# Patient Record
Sex: Female | Born: 2000 | Race: Black or African American | Hispanic: No | Marital: Single | Smoking: Never smoker
Health system: Southern US, Community
[De-identification: ages and names within clinical notes are randomized; demographics above are authoritative.]

## PROBLEM LIST (undated history)

## (undated) DIAGNOSIS — Z789 Other specified health status: Secondary | ICD-10-CM

## (undated) DIAGNOSIS — W3400XA Accidental discharge from unspecified firearms or gun, initial encounter: Secondary | ICD-10-CM

## (undated) DIAGNOSIS — Y249XXA Unspecified firearm discharge, undetermined intent, initial encounter: Secondary | ICD-10-CM

## (undated) HISTORY — PX: APPENDECTOMY: SHX54

---
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
## 2021-07-23 ENCOUNTER — Inpatient Hospital Stay (HOSPITAL_COMMUNITY)
Admission: AD | Admit: 2021-07-23 | Discharge: 2021-07-26 | DRG: 831 | Disposition: A | Discharge status: Home / Self Care | Payer: Medicaid Other | Attending: Obstetrics & Gynecology | Admitting: Obstetrics & Gynecology

## 2021-07-23 ENCOUNTER — Encounter (HOSPITAL_COMMUNITY): Payer: Self-pay

## 2021-07-23 ENCOUNTER — Other Ambulatory Visit: Payer: Self-pay

## 2021-07-23 DIAGNOSIS — O98513 Other viral diseases complicating pregnancy, third trimester: Secondary | ICD-10-CM | POA: Diagnosis present

## 2021-07-23 DIAGNOSIS — O200 Threatened abortion: Secondary | ICD-10-CM | POA: Diagnosis not present

## 2021-07-23 DIAGNOSIS — Z3A34 34 weeks gestation of pregnancy: Secondary | ICD-10-CM

## 2021-07-23 DIAGNOSIS — O3433 Maternal care for cervical incompetence, third trimester: Secondary | ICD-10-CM

## 2021-07-23 DIAGNOSIS — D563 Thalassemia minor: Secondary | ICD-10-CM | POA: Diagnosis present

## 2021-07-23 DIAGNOSIS — U071 COVID-19: Secondary | ICD-10-CM | POA: Diagnosis present

## 2021-07-23 DIAGNOSIS — Z3A35 35 weeks gestation of pregnancy: Secondary | ICD-10-CM | POA: Diagnosis not present

## 2021-07-23 DIAGNOSIS — Z3689 Encounter for other specified antenatal screening: Secondary | ICD-10-CM

## 2021-07-23 HISTORY — DX: Other specified health status: Z78.9

## 2021-07-23 LAB — RESP PANEL BY RT-PCR (FLU A&B, COVID) ARPGX2
Influenza A by PCR: NEGATIVE
Influenza B by PCR: NEGATIVE
SARS Coronavirus 2 by RT PCR: POSITIVE — AB

## 2021-07-23 LAB — DIFFERENTIAL
Abs Immature Granulocytes: 0.12 10*3/uL — ABNORMAL HIGH (ref 0.00–0.07)
Basophils Absolute: 0 10*3/uL (ref 0.0–0.1)
Basophils Relative: 0 %
Eosinophils Absolute: 0.2 10*3/uL (ref 0.0–0.5)
Eosinophils Relative: 2 %
Immature Granulocytes: 1 %
Lymphocytes Relative: 20 %
Lymphs Abs: 2.1 10*3/uL (ref 0.7–4.0)
Monocytes Absolute: 0.9 10*3/uL (ref 0.1–1.0)
Monocytes Relative: 9 %
Neutro Abs: 7 10*3/uL (ref 1.7–7.7)
Neutrophils Relative %: 68 %

## 2021-07-23 LAB — RAPID HIV SCREEN (HIV 1/2 AB+AG)
HIV 1/2 Antibodies: NONREACTIVE
HIV-1 P24 Antigen - HIV24: NONREACTIVE

## 2021-07-23 LAB — CBC
HCT: 34.8 % — ABNORMAL LOW (ref 36.0–46.0)
Hemoglobin: 11.5 g/dL — ABNORMAL LOW (ref 12.0–15.0)
MCH: 28 pg (ref 26.0–34.0)
MCHC: 33 g/dL (ref 30.0–36.0)
MCV: 84.9 fL (ref 80.0–100.0)
Platelets: 311 10*3/uL (ref 150–400)
RBC: 4.1 MIL/uL (ref 3.87–5.11)
RDW: 13.1 % (ref 11.5–15.5)
WBC: 10.6 10*3/uL — ABNORMAL HIGH (ref 4.0–10.5)
nRBC: 0 % (ref 0.0–0.2)

## 2021-07-23 LAB — TYPE AND SCREEN
ABO/RH(D): A POS
Antibody Screen: NEGATIVE

## 2021-07-23 LAB — HEPATITIS C ANTIBODY: HCV Ab: NONREACTIVE

## 2021-07-23 LAB — OB RESULTS CONSOLE HIV ANTIBODY (ROUTINE TESTING): HIV: NONREACTIVE

## 2021-07-23 LAB — HEPATITIS B SURFACE ANTIGEN: Hepatitis B Surface Ag: NONREACTIVE

## 2021-07-23 MED ORDER — LACTATED RINGERS IV SOLN: 500.0000 mL | INTRAVENOUS | Status: DC | PRN

## 2021-07-23 MED ORDER — CALCIUM CARBONATE ANTACID 500 MG PO CHEW: 2.0000 | CHEWABLE_TABLET | ORAL | Status: DC | PRN

## 2021-07-23 MED ORDER — DOCUSATE SODIUM 100 MG PO CAPS
100.0000 mg | ORAL_CAPSULE | Freq: Every day | ORAL | Status: DC
  Filled 2021-07-23 (×2): qty 1

## 2021-07-23 MED ORDER — SODIUM CHLORIDE 0.9 % IV SOLN
5.0000 10*6.[IU] | Freq: Once | INTRAVENOUS | Status: AC
  Administered 2021-07-23: 5 10*6.[IU] via INTRAVENOUS
  Filled 2021-07-23: qty 5

## 2021-07-23 MED ORDER — SODIUM CHLORIDE 0.9 % IV SOLN: 250.0000 mL | INTRAVENOUS | Status: DC | PRN

## 2021-07-23 MED ORDER — LACTATED RINGERS IV BOLUS
1000.0000 mL | Freq: Once | INTRAVENOUS | Status: AC
  Administered 2021-07-23: 1000 mL via INTRAVENOUS

## 2021-07-23 MED ORDER — SODIUM CHLORIDE 0.9% FLUSH
3.0000 mL | Freq: Two times a day (BID) | INTRAVENOUS | Status: DC
  Administered 2021-07-24: 3 mL via INTRAVENOUS

## 2021-07-23 MED ORDER — ACETAMINOPHEN 325 MG PO TABS: 650.0000 mg | ORAL_TABLET | ORAL | Status: DC | PRN

## 2021-07-23 MED ORDER — OXYTOCIN-SODIUM CHLORIDE 30-0.9 UT/500ML-% IV SOLN: 2.5000 [IU]/h | INTRAVENOUS | Status: DC

## 2021-07-23 MED ORDER — LACTATED RINGERS IV SOLN: INTRAVENOUS | Status: DC

## 2021-07-23 MED ORDER — OXYTOCIN BOLUS FROM INFUSION: 333.0000 mL | Freq: Once | INTRAVENOUS | Status: DC

## 2021-07-23 MED ORDER — SODIUM CHLORIDE 0.9% FLUSH
3.0000 mL | INTRAVENOUS | Status: DC | PRN
  Administered 2021-07-24: 3 mL via INTRAVENOUS

## 2021-07-23 MED ORDER — OXYCODONE-ACETAMINOPHEN 5-325 MG PO TABS: 1.0000 | ORAL_TABLET | ORAL | Status: DC | PRN

## 2021-07-23 MED ORDER — LIDOCAINE HCL (PF) 1 % IJ SOLN: 30.0000 mL | INTRAMUSCULAR | Status: DC | PRN

## 2021-07-23 MED ORDER — ZOLPIDEM TARTRATE 5 MG PO TABS: 5.0000 mg | ORAL_TABLET | Freq: Every evening | ORAL | Status: DC | PRN

## 2021-07-23 MED ORDER — OXYCODONE-ACETAMINOPHEN 5-325 MG PO TABS: 2.0000 | ORAL_TABLET | ORAL | Status: DC | PRN

## 2021-07-23 MED ORDER — PENICILLIN G POT IN DEXTROSE 60000 UNIT/ML IV SOLN
3.0000 10*6.[IU] | INTRAVENOUS | Status: DC
  Administered 2021-07-23 (×2): 3 10*6.[IU] via INTRAVENOUS
  Filled 2021-07-23 (×5): qty 50

## 2021-07-23 MED ORDER — ONDANSETRON HCL 4 MG/2ML IJ SOLN: 4.0000 mg | Freq: Four times a day (QID) | INTRAMUSCULAR | Status: DC | PRN

## 2021-07-23 MED ORDER — PRENATAL MULTIVITAMIN CH
1.0000 | ORAL_TABLET | Freq: Every day | ORAL | Status: DC
  Administered 2021-07-24 – 2021-07-25 (×2): 1 via ORAL
  Filled 2021-07-23 (×2): qty 1

## 2021-07-23 MED ORDER — SOD CITRATE-CITRIC ACID 500-334 MG/5ML PO SOLN: 30.0000 mL | ORAL | Status: DC | PRN

## 2021-07-23 NOTE — Progress Notes (Signed)
Charlene Berry is a 21 y.o. G1P0 at [redacted]w[redacted]d admitted for Preterm labor  Subjective: Reports feels some contractions and pressure but not more than before. Denies any rectal pressure.  Objective: BP (!) 103/51    Pulse 79    Temp 98.3 F (36.8 C) (Oral)    Resp 18    Ht 5' (1.524 m)    Wt 60.8 kg    SpO2 98%    BMI 26.17 kg/m  No intake/output data recorded. No intake/output data recorded.  FHT:  FHR: 120 bpm, variability: moderate,  accelerations:  Present,  decelerations:  Absent UC:   irregular, every 2-5 minutes SVE:   Dilation: 5 Effacement (%): 60 Station: 0 Exam by:: Donia Ast, NP  Labs: Lab Results  Component Value Date   WBC 10.6 (H) 07/23/2021   HGB 11.5 (L) 07/23/2021   HCT 34.8 (L) 07/23/2021   MCV 84.9 07/23/2021   PLT 311 07/23/2021    Assessment / Plan: G1P0 at [redacted]w[redacted]d admitted for Preterm labor  Labor:  patient feeling some contractions but no large change in symptoms. On toco uterine irritability noted and then some contractions that are irregular. Discussed with patient that we can hold off on checking her cervix unless she feels any different or constant pressure or feeling urge to push. She expresses understanding and is in agreement with plan  Fetal Wellbeing:  Category I Pain Control:   None I/D:   GBS unk>PCN   #COVID+ Asymptomatic at this time. Had symptoms a couple of weeks ago.  Warner Mccreedy 07/23/2021, 4:55 PM

## 2021-07-23 NOTE — Progress Notes (Signed)
Charlene Berry is a 21 y.o. G1P0 at [redacted]w[redacted]d admitted for Preterm labor  Subjective: Reports feels some contractions however has mostly been able to sleep through them.  Objective: BP (!) 85/50 Comment: pt asymptomatic; on side   Pulse 91    Temp 98.5 F (36.9 C) (Oral)    Resp 16    Ht 5' (1.524 m)    Wt 60.8 kg    SpO2 98%    BMI 26.17 kg/m  No intake/output data recorded. No intake/output data recorded.  FHT:  FHR: 130 bpm, variability: moderate,  accelerations:  Present,  decelerations:  Absent UC:  irritability SVE:   Dilation: 5 Effacement (%): 60, 70 Station: 0 Exam by:: D. Simspon, CNM  Labs: Lab Results  Component Value Date   WBC 10.6 (H) 07/23/2021   HGB 11.5 (L) 07/23/2021   HCT 34.8 (L) 07/23/2021   MCV 84.9 07/23/2021   PLT 311 07/23/2021    Assessment / Plan: G1P0 at [redacted]w[redacted]d admitted for Preterm labor  Labor: patient feeling some contractions but is mostly able to sleep through them. Toco shows some irritability. Cervix unchanged since 0915. Discussed with patient that given no change in cervix will transfer to [facility]. Dr. Debroah Loop notified of patient status.  Fetal Wellbeing:  Category I Pain Control:   None I/D:   GBS unk>PCN   #COVID+ Asymptomatic at this time. Had symptoms a couple of weeks ago.   Brand Males, CNM 07/23/2021, 9:05 PM

## 2021-07-23 NOTE — Progress Notes (Signed)
Patient ID: Charlene Berry, female   DOB: 30-Aug-2000, 21 y.o.   MRN: [id number] Covid resulted as positive Patient states she and her family were sick a couple of weeks ago  Vitals:   07/23/21 1011 07/23/21 1015 07/23/21 1217 07/23/21 1434  BP: 116/66  108/63 (!) 97/48  Pulse: 93  90 89  Resp: 16  16 17   Temp: 98.2 F (36.8 C)   98.3 F (36.8 C)  TempSrc: Oral   Oral  SpO2:      Weight:  60.8 kg    Height:  5' (1.524 m)     FHR reactive 120s baseline with accels Uterine irritability with occasional contractions  Cervical exam deferred

## 2021-07-23 NOTE — MAU Note (Signed)
Pt reports to mau with c/o ctx since yesterday.  Pt states she was at [facility] and then sent to a hospital in [place] by ems yesterday.  Reports she was 4cm dilated and sent home.  EDD 08-29-21  Reports she lost mucous plug this morning and is still having ctx.  Denies LOF
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
## 2021-07-24 ENCOUNTER — Encounter (HOSPITAL_COMMUNITY): Payer: Self-pay | Admitting: Obstetrics & Gynecology

## 2021-07-24 LAB — RUBELLA SCREEN: Rubella: 6.99 index (ref >0.99)

## 2021-07-24 LAB — RPR: RPR Ser Ql: NONREACTIVE

## 2021-07-24 NOTE — Progress Notes (Signed)
Patient ID: Charlene Berry, female   DOB: 10/22/2000, 21 y.o.   MRN: [id number] FACULTY PRACTICE ANTEPARTUM(COMPREHENSIVE) NOTE  Charlene Berry is a 21 y.o. G1P0 at [redacted]w[redacted]d by best clinical estimate who is admitted for Preterm labor.   Fetal presentation is cephalic. Length of Stay:  1  Days  Subjective: Irregular mild cramps Patient reports the fetal movement as active. Patient reports uterine contraction  activity as irregular. Patient reports  vaginal bleeding as none. Patient describes fluid per vagina as None.  Vitals:  Blood pressure 115/69, pulse 91, temperature 97.7 F (36.5 C), temperature source Oral, resp. rate 16, height 5' (1.524 m), weight 60.8 kg, SpO2 98 %. Physical Examination:  General appearance - alert, well appearing, and in no distress Heart - normal rate and regular rhythm Abdomen - soft, nontender, nondistended Fundal Height:  size equals dates Cervical Exam: Not evaluated. . Extremities: extremities normal, atraumatic, no cyanosis or edema and Homans sign is negative, no sign of DVT  Membranes:intact  Fetal Monitoring:   Fetal Heart Rate A   Mode --  [monitors removed,  patient up to the bathroom. Shower] filed at 07/23/2021 2316  Baseline Rate (A) 125 bpm filed at 07/23/2021 2312  Variability 6-25 BPM filed at 07/23/2021 2312  Accelerations 15 x 15 filed at 07/23/2021 2312  Decelerations None filed at 07/23/2021 2312    Labs:  Results for orders placed or performed during the hospital encounter of 07/23/21 (from the past 24 hour(s))  CBC   Collection Time: 07/23/21  9:44 AM  Result Value Ref Range   WBC 10.6 (H) 4.0 - 10.5 K/uL   RBC 4.10 3.87 - 5.11 MIL/uL   Hemoglobin 11.5 (L) 12.0 - 15.0 g/dL   HCT 35.5 (L) 73.2 - 20.2 %   MCV 84.9 80.0 - 100.0 fL   MCH 28.0 26.0 - 34.0 pg   MCHC 33.0 30.0 - 36.0 g/dL   RDW 54.2 70.6 - 23.7 %   Platelets 311 150 - 400 K/uL   nRBC 0.0 0.0 - 0.2 %  Hepatitis B surface antigen   Collection Time: 07/23/21  9:44  AM  Result Value Ref Range   Hepatitis B Surface Ag NON REACTIVE NON REACTIVE  Rapid HIV screen (HIV 1/2 Ab+Ag)   Collection Time: 07/23/21  9:44 AM  Result Value Ref Range   HIV-1 P24 Antigen - HIV24 NON REACTIVE NON REACTIVE   HIV 1/2 Antibodies NON REACTIVE NON REACTIVE   Interpretation (HIV Ag Ab)      A non reactive test result means that HIV 1 or HIV 2 antibodies and HIV 1 p24 antigen were not detected in the specimen.  Differential   Collection Time: 07/23/21  9:44 AM  Result Value Ref Range   Neutrophils Relative % 68 %   Neutro Abs 7.0 1.7 - 7.7 K/uL   Lymphocytes Relative 20 %   Lymphs Abs 2.1 0.7 - 4.0 K/uL   Monocytes Relative 9 %   Monocytes Absolute 0.9 0.1 - 1.0 K/uL   Eosinophils Relative 2 %   Eosinophils Absolute 0.2 0.0 - 0.5 K/uL   Basophils Relative 0 %   Basophils Absolute 0.0 0.0 - 0.1 K/uL   Immature Granulocytes 1 %   Abs Immature Granulocytes 0.12 (H) 0.00 - 0.07 K/uL  Type and screen [facility]   Collection Time: 07/23/21  9:44 AM  Result Value Ref Range   ABO/RH(D) A POS    Antibody Screen NEG  Sample Expiration      07/26/2021,2359 Performed at [facility], [address], [place], [place] [address]   Resp Panel by RT-PCR (Flu A&B, Covid) Nasopharyngeal Swab   Collection Time: 07/23/21  9:55 AM   Specimen: Nasopharyngeal Swab; Nasopharyngeal(NP) swabs in vial transport medium  Result Value Ref Range   SARS Coronavirus 2 by RT PCR POSITIVE (A) NEGATIVE   Influenza A by PCR NEGATIVE NEGATIVE   Influenza B by PCR NEGATIVE NEGATIVE  Hepatitis C antibody   Collection Time: 07/23/21  9:56 AM  Result Value Ref Range   HCV Ab NON REACTIVE NON REACTIVE     Medications:  Scheduled  docusate sodium  100 mg Oral Daily   oxytocin 40 units in LR 1000 mL  333 mL Intravenous Once   prenatal multivitamin  1 tablet Oral Q1200   sodium chloride flush  3 mL Intravenous Q12H   I have reviewed the patient's current  medications.  ASSESSMENT: Patient Active Problem List   Diagnosis Date Noted   Preterm labor 07/23/2021    PLAN: Observe for s/sx preterm labor and consider cervical exam as indicated  Scheryl Darter 07/24/2021,7:31 AM

## 2021-07-25 LAB — GC/CHLAMYDIA PROBE AMP (~~LOC~~) NOT AT ARMC
Chlamydia: NEGATIVE
Comment: NEGATIVE
Comment: NORMAL
Neisseria Gonorrhea: NEGATIVE

## 2021-07-25 LAB — CULTURE, BETA STREP (GROUP B ONLY)

## 2021-07-25 LAB — URINALYSIS, ROUTINE W REFLEX MICROSCOPIC
Bilirubin Urine: NEGATIVE
Glucose, UA: NEGATIVE mg/dL
Hgb urine dipstick: NEGATIVE
Ketones, ur: NEGATIVE mg/dL
Nitrite: NEGATIVE
Protein, ur: NEGATIVE mg/dL
Specific Gravity, Urine: <1.005 — ABNORMAL LOW (ref 1.005–1.030)
pH: 6 (ref 5.0–8.0)

## 2021-07-25 LAB — URINALYSIS, MICROSCOPIC (REFLEX)

## 2021-07-25 LAB — RESP PANEL BY RT-PCR (FLU A&B, COVID) ARPGX2
Influenza A by PCR: NEGATIVE
Influenza B by PCR: NEGATIVE
SARS Coronavirus 2 by RT PCR: POSITIVE — AB

## 2021-07-25 MED ORDER — LACTATED RINGERS IV BOLUS: 500.0000 mL | Freq: Once | INTRAVENOUS | Status: DC

## 2021-07-25 NOTE — Progress Notes (Signed)
FACULTY PRACTICE ANTEPARTUM PROGRESS NOTE  Charlene Berry is a 21 y.o. G1P0 at [redacted]w[redacted]d who is admitted for advanced cervical dilation.  Estimated Date of Delivery: 08/29/21 Fetal presentation is cephalic.  Length of Stay:  2 Days. Admitted 07/23/2021  Subjective: Pt seen this morning.  She has noted intermittent contractions today.  Positive fetal movement noted.  Pt appears comfortable. Shedenies bleeding and leaking of fluid per vagina.  Vitals:  Blood pressure 115/71, pulse 88, temperature 98.5 F (36.9 C), temperature source Oral, resp. rate 16, height 5' (1.524 m), weight 60.8 kg, SpO2 100 %. Physical Examination: CONSTITUTIONAL: Well-developed, well-nourished female in no acute distress.  HENT:  Normocephalic, atraumatic, External right and left ear normal. Oropharynx is clear and moist EYES: Conjunctivae and EOM are normal.  NECK: Normal range of motion, supple, no masses. SKIN: Skin is warm and dry. No rash noted. Not diaphoretic. No erythema. No pallor. NEUROLGIC: Alert and oriented to person, place, and time. Normal reflexes, muscle tone coordination. No cranial nerve deficit noted. PSYCHIATRIC: Normal mood and affect. Normal behavior. Normal judgment and thought content. CARDIOVASCULAR: Normal heart rate noted, regular rhythm RESPIRATORY: Effort and breath sounds normal, no problems with respiration noted MUSCULOSKELETAL: Normal range of motion. No edema and no tenderness. ABDOMEN: Soft, nontender, nondistended, gravid. CERVIX: deferred  Fetal monitoring: FHR: 120 bpm, Variability: moderate, Accelerations: Present, Decelerations: Absent  Uterine activity: q , pt comfortable with contractions.  Uterus palpated as firm  Results for orders placed or performed during the hospital encounter of 07/23/21 (from the past 48 hour(s))  Urinalysis, Routine w reflex microscopic Urine, Clean Catch     Status: Abnormal   Collection Time: 07/25/21  3:30 AM  Result Value Ref Range    Color, Urine YELLOW YELLOW   APPearance CLEAR CLEAR   Specific Gravity, Urine <1.005 (L) 1.005 - 1.030   pH 6.0 5.0 - 8.0   Glucose, UA NEGATIVE NEGATIVE mg/dL   Hgb urine dipstick NEGATIVE NEGATIVE   Bilirubin Urine NEGATIVE NEGATIVE   Ketones, ur NEGATIVE NEGATIVE mg/dL   Protein, ur NEGATIVE NEGATIVE mg/dL   Nitrite NEGATIVE NEGATIVE   Leukocytes,Ua TRACE (A) NEGATIVE    Comment: Performed at [facility], [address], [place], [place] [address]  Urinalysis, Microscopic (reflex)     Status: Abnormal   Collection Time: 07/25/21  3:30 AM  Result Value Ref Range   RBC / HPF 0-5 0 - 5 RBC/hpf   WBC, UA 0-5 0 - 5 WBC/hpf   Bacteria, UA RARE (A) NONE SEEN   Squamous Epithelial / LPF 0-5 0 - 5   Mucus PRESENT     Comment: Performed at [facility], [address] [place], [place] [address]    I have reviewed the patient's current medications.  ASSESSMENT: Principal Problem:   Preterm labor   PLAN: Continue monitoring Discussed cervical exam on 07/26/21.  If stable consider discharge. If continues with contractions remain inpatient until 37 weeks.  If pt is stable D/C home   Continue routine antenatal care.   Mariel Aloe, MD [facility] Faculty Attending, Center for [facility] 07/25/2021 12:36 PM

## 2021-07-25 NOTE — Progress Notes (Signed)
Pt reports increased contractions since about 0630 this morning, rating 6/10 on pain scale. Palpating mild to moderate, q7-8 min lasting 70-120sec. Dr. Elgie Congo notified and 500 mL LR bolus ordered, which patient refused after RN educated on benefits of IV hydration for preterm labor. Pt remains on monitor per Dr. Elgie Congo request. RN recommended PO hydration, brought new water pitcher to bedside, and requested pt to notify of any changes (LOF, bleeding, ctx, fetal movement).

## 2021-07-26 DIAGNOSIS — Z3A35 35 weeks gestation of pregnancy: Secondary | ICD-10-CM

## 2021-07-26 DIAGNOSIS — O98513 Other viral diseases complicating pregnancy, third trimester: Secondary | ICD-10-CM

## 2021-07-26 DIAGNOSIS — U071 COVID-19: Secondary | ICD-10-CM

## 2021-07-26 MED ORDER — PRENATAL MULTIVITAMIN CH: 1.0000 | ORAL_TABLET | Freq: Every day | ORAL | Status: AC

## 2021-07-26 MED ORDER — CALCIUM CARBONATE ANTACID 500 MG PO CHEW: 2.0000 | CHEWABLE_TABLET | ORAL | Status: AC | PRN

## 2021-07-26 MED ORDER — ACETAMINOPHEN 325 MG PO TABS: 650.0000 mg | ORAL_TABLET | ORAL | Status: AC | PRN

## 2021-07-26 NOTE — Progress Notes (Signed)
Pt seen for cervical exam.  Cervix was 3/70/0-1+.  Exam improved from previous.  She denied organized contractions.  Pt will be discharged home.  Pt previously seen this AM by Dr. Para March.  Discharge home.   Mariel Aloe, MD Center for [facility]

## 2021-07-26 NOTE — Discharge Summary (Signed)
Antenatal Physician Discharge Summary  Patient ID: Charlene Berry MRN: [id number] DOB/AGE: 2000/11/03 21 y.o.  Admit date: 07/23/2021 Discharge date: 07/26/2021  Admission Diagnoses: Threatened Preterm labor Pregnancy with 35 completed weeks Advanced cervical dilation COVID positive  Discharge Diagnoses:  Threatened Preterm labor Pregnancy with 35 completed weeks Advanced cervical dilation COVID positive  Prenatal Procedures: CEFM  Consults: None  Hospital Course:  Charlene Berry is a 21 y.o. G1P0 with IUP at 2861w1d admitted for PT contractions in setting of advanced cervical dilation.  She was admitted with contractions, noted to have a cervical exam of 5.  No leaking of fluid and no bleeding. She was given PCN for unknown GBS which the culture has since come back GBS negative.  Complete prenatal labs were checked here as well.  During her admission she was found to be asymptomatic positive for COVID and was on contact precautions during her stay. She was observed, fetal heart rate monitoring remained reassuring, and she had no signs/symptoms of progressing preterm labor or other maternal-fetal concerns.  Her cervical exam has remained unchanged during her admission and on day of discharge in the afternoon, Dr. Donavan FoilBass will recheck her cervix before the patient would actually go to ensure no change.   Pt thinks if she goes into labor before 37 weeks, she will come here for delivery.  She was deemed stable for discharge to home with outpatient follow up and labor precautions.   Discharge Exam: Temp:  [98.5 F (36.9 C)-98.6 F (37 C)] 98.6 F (37 C) (01/30 2233) Pulse Rate:  [82-98] 82 (01/30 2233) Resp:  [16-18] 16 (01/30 2233) BP: (99-123)/(46-74) 99/46 (01/30 2233) SpO2:  [99 %-100 %] 99 % (01/30 2233) Physical Examination: CONSTITUTIONAL: Well-developed, well-nourished female in no acute distress.  HENT:  Normocephalic, atraumatic, External right and left ear normal.  EYES:  Conjunctivae and EOM are normal. Pupils are equal, round, and reactive to light. No scleral icterus.  NECK: Normal range of motion, supple, no masses SKIN: Skin is warm and dry. No rash noted. Not diaphoretic. No erythema. No pallor. NEUROLOGIC: Alert and oriented to person, place, and time. Normal reflexes, muscle tone coordination. No cranial nerve deficit noted. PSYCHIATRIC: Normal mood and affect. Normal behavior. Normal judgment and thought content. CARDIOVASCULAR: Normal heart rate noted, regular rhythm RESPIRATORY: Effort and breath sounds normal, no problems with respiration noted MUSCULOSKELETAL: Normal range of motion. No edema and no tenderness. 2+ distal pulses. ABDOMEN: Soft, nontender, nondistended, gravid. CERVIX: Dilation: 5 Effacement (%): 60, 70 Cervical Position: Posterior Station: 0 Presentation: Vertex Exam by:: D. Simspon, CNM  Fetal monitoring: FHR: 130 bpm, Variability: moderate, Accelerations: Present, Decelerations: Absent  Uterine activity: one contractions per hour  Significant Diagnostic Studies:  Results for orders placed or performed during the hospital encounter of 07/23/21 (from the past 168 hour(s))  OB RESULTS CONSOLE HIV antibody   Collection Time: 07/23/21 12:00 AM  Result Value Ref Range   HIV Non-reactive   CBC   Collection Time: 07/23/21  9:44 AM  Result Value Ref Range   WBC 10.6 (H) 4.0 - 10.5 K/uL   RBC 4.10 3.87 - 5.11 MIL/uL   Hemoglobin 11.5 (L) 12.0 - 15.0 g/dL   HCT 40.934.8 (L) 81.136.0 - 91.446.0 %   MCV 84.9 80.0 - 100.0 fL   MCH 28.0 26.0 - 34.0 pg   MCHC 33.0 30.0 - 36.0 g/dL   RDW 78.213.1 95.611.5 - 21.315.5 %   Platelets 311 150 - 400 K/uL   nRBC  0.0 0.0 - 0.2 %  RPR   Collection Time: 07/23/21  9:44 AM  Result Value Ref Range   RPR Ser Ql NON REACTIVE NON REACTIVE  Hepatitis B surface antigen   Collection Time: 07/23/21  9:44 AM  Result Value Ref Range   Hepatitis B Surface Ag NON REACTIVE NON REACTIVE  Rapid HIV screen (HIV 1/2 Ab+Ag)    Collection Time: 07/23/21  9:44 AM  Result Value Ref Range   HIV-1 P24 Antigen - HIV24 NON REACTIVE NON REACTIVE   HIV 1/2 Antibodies NON REACTIVE NON REACTIVE   Interpretation (HIV Ag Ab)      A non reactive test result means that HIV 1 or HIV 2 antibodies and HIV 1 p24 antigen were not detected in the specimen.  Differential   Collection Time: 07/23/21  9:44 AM  Result Value Ref Range   Neutrophils Relative % 68 %   Neutro Abs 7.0 1.7 - 7.7 K/uL   Lymphocytes Relative 20 %   Lymphs Abs 2.1 0.7 - 4.0 K/uL   Monocytes Relative 9 %   Monocytes Absolute 0.9 0.1 - 1.0 K/uL   Eosinophils Relative 2 %   Eosinophils Absolute 0.2 0.0 - 0.5 K/uL   Basophils Relative 0 %   Basophils Absolute 0.0 0.0 - 0.1 K/uL   Immature Granulocytes 1 %   Abs Immature Granulocytes 0.12 (H) 0.00 - 0.07 K/uL  Type and screen [facility]   Collection Time: 07/23/21  9:44 AM  Result Value Ref Range   ABO/RH(D) A POS    Antibody Screen NEG    Sample Expiration      07/26/2021,2359 Performed at [facility], [address] [place], [place] [address]   GC/Chlamydia probe amp ([place])not at [facility]   Collection Time: 07/23/21  9:54 AM  Result Value Ref Range   Neisseria Gonorrhea Negative    Chlamydia Negative    Comment Normal Reference [place] Chlamydia - Negative    Comment      Normal Reference Range Neisseria Gonorrhea - Negative  Culture, beta strep (group b only)   Collection Time: 07/23/21  9:55 AM   Specimen: Vaginal/Rectal; Genital  Result Value Ref Range   Specimen Description VAGINAL/RECTAL    Special Requests NONE    Culture      NO GROUP B STREP (S.AGALACTIAE) ISOLATED Performed at [facility], [address] [place], [place] [address]    Report Status 07/25/2021 FINAL   Resp Panel by RT-PCR (Flu A&B, Covid) Nasopharyngeal Swab   Collection Time: 07/23/21  9:55 AM   Specimen: Nasopharyngeal Swab; Nasopharyngeal(NP) swabs in vial transport medium   Result Value Ref Range   SARS Coronavirus 2 by RT PCR POSITIVE (A) NEGATIVE   Influenza A by PCR NEGATIVE NEGATIVE   Influenza B by PCR NEGATIVE NEGATIVE  Rubella screen   Collection Time: 07/23/21  9:56 AM  Result Value Ref Range   Rubella 6.99 Immune >0.99 index  Hepatitis C antibody   Collection Time: 07/23/21  9:56 AM  Result Value Ref Range   HCV Ab NON REACTIVE NON REACTIVE  Urinalysis, Routine w reflex microscopic Urine, Clean Catch   Collection Time: 07/25/21  3:30 AM  Result Value Ref Range   Color, Urine YELLOW YELLOW   APPearance CLEAR CLEAR   Specific Gravity, Urine <1.005 (L) 1.005 - 1.030   pH 6.0 5.0 - 8.0   Glucose, UA NEGATIVE NEGATIVE mg/dL   Hgb urine dipstick NEGATIVE  NEGATIVE   Bilirubin Urine NEGATIVE NEGATIVE   Ketones, ur NEGATIVE NEGATIVE mg/dL   Protein, ur NEGATIVE NEGATIVE mg/dL   Nitrite NEGATIVE NEGATIVE   Leukocytes,Ua TRACE (A) NEGATIVE  Urinalysis, Microscopic (reflex)   Collection Time: 07/25/21  3:30 AM  Result Value Ref Range   RBC / HPF 0-5 0 - 5 RBC/hpf   WBC, UA 0-5 0 - 5 WBC/hpf   Bacteria, UA RARE (A) NONE SEEN   Squamous Epithelial / LPF 0-5 0 - 5   Mucus PRESENT   Resp Panel by RT-PCR (Flu A&B, Covid) Nasopharyngeal Swab   Collection Time: 07/25/21 11:55 AM   Specimen: Nasopharyngeal Swab; Nasopharyngeal(NP) swabs in vial transport medium  Result Value Ref Range   SARS Coronavirus 2 by RT PCR POSITIVE (A) NEGATIVE   Influenza A by PCR NEGATIVE NEGATIVE   Influenza B by PCR NEGATIVE NEGATIVE   No results found.  No future appointments.  Discharge Condition: Stable     Allergies as of 07/26/2021   No Known Allergies      Medication List     STOP taking these medications    cephALEXin 500 MG capsule Commonly known as: KEFLEX   HYDROcodone-acetaminophen 5-325 MG tablet Commonly known as: Norco       TAKE these medications    acetaminophen 325 MG tablet Commonly known as: TYLENOL Take 2 tablets (650 mg  total) by mouth every 4 (four) hours as needed (for pain scale < 4  OR  temperature  >/=  100.5 F).   calcium carbonate 500 MG chewable tablet Commonly known as: TUMS - dosed in mg elemental calcium Chew 2 tablets (400 mg of elemental calcium total) by mouth every 4 (four) hours as needed for indigestion.   prenatal multivitamin Tabs tablet Take 1 tablet by mouth daily at 12 noon.        Follow-up Information     [facility] Follow up.   Specialty: Obstetrics and Gynecology Why: Follow up with your regular doctor in [place] in the next 1-2 days.                Total discharge time: 45 minutes   Signed: Milas Hock M.D. 07/26/2021, 7:59 AM
